# Patient Record
Sex: Male | Born: 1975 | Race: White | Hispanic: No | Marital: Married
Health system: Southern US, Community
[De-identification: ages and names within clinical notes are randomized; demographics above are authoritative.]

---

## 2006-06-26 ENCOUNTER — Emergency Department (HOSPITAL_COMMUNITY): Admission: EM | Admit: 2006-06-26 | Discharge: 2006-06-26 | Payer: Self-pay | Admitting: Family Medicine

## 2007-08-22 ENCOUNTER — Encounter: Admission: RE | Admit: 2007-08-22 | Discharge: 2007-08-22 | Payer: Self-pay | Admitting: Orthopedic Surgery

## 2007-08-28 ENCOUNTER — Encounter: Admission: RE | Admit: 2007-08-28 | Discharge: 2007-08-28 | Payer: Self-pay | Admitting: Orthopedic Surgery

## 2007-09-12 ENCOUNTER — Encounter: Admission: RE | Admit: 2007-09-12 | Discharge: 2007-09-12 | Payer: Self-pay | Admitting: Orthopedic Surgery

## 2008-03-06 ENCOUNTER — Encounter: Admission: RE | Admit: 2008-03-06 | Discharge: 2008-03-06 | Payer: Self-pay | Admitting: Orthopedic Surgery

## 2008-03-22 ENCOUNTER — Ambulatory Visit (HOSPITAL_COMMUNITY): Admission: RE | Admit: 2008-03-22 | Discharge: 2008-03-23 | Payer: Self-pay | Admitting: Neurosurgery

## 2010-11-29 ENCOUNTER — Encounter: Payer: Self-pay | Admitting: Orthopedic Surgery

## 2011-03-23 NOTE — H&P (Signed)
NAMERAYWOOD, WAILES NO.:  [id number]   MEDICAL RECORD NO.:  [id number]          PATIENT TYPE:  OIB   LOCATION:  3006                         FACILITY:  [facility]   PHYSICIAN:  Payton Doughty, M.D.      DATE OF BIRTH:  1976-10-09   DATE OF ADMISSION:  03/22/2008  DATE OF DISCHARGE:                              HISTORY & PHYSICAL   ADMITTING DIAGNOSIS:  Herniated disk at L5-S1, right.   A 35 year old right-handed white gentleman, who last summer had pain in  his back, down his right leg.  He had an MRI, which showed a disk, had  epidurals, did well for a while, and then in April, an appointment was  scheduled as he had an increasing pain down in his back and his right  leg.  MRI showed a large disk off to the right, and he is now admitted  for a lumbar discectomy.   MEDICAL HISTORY:  Benign.   SURGERIES:  Wisdom teeth.   MEDICATIONS:  He is on:  1. Skelaxin.  2. Oxycodone.  3. Etodolac.   SOCIAL HISTORY:  He smokes a pack of cigarettes a day, drinks alcohol on  a social basis and is a Nutritional therapist.   FAMILY HISTORY:  Mom is 24, in good health, and dad is 40, in good  health.   REVIEW OF SYSTEMS:  Remarkable for back pain, leg pain, and occasional  blackouts.   PHYSICAL EXAMINATION:  HEENT:  Within normal limits.  NECK:  He has good range of motion.  CHEST:  Clear.  CARDIAC:  Regular rate and rhythm.  ABDOMEN:  Nontender.  No hepatosplenomegaly.  EXTREMITIES:  Without clubbing or cyanosis.  Peripheral pulses are good.  GU:  Deferred.  NEUROLOGIC:  He is awake, alert, and oriented.  Cranial nerves are  intact.  Motor exam shows 5/5 strength throughout in upper and lower  extremities.  Sensory dysesthesias described in the right S1  distribution.  Reflexes are 2 at the knees, 2 at the left ankle, 1 at  the right.  Straight leg raise is positive on the right.  Reverse  straight leg raise is not positive.   MRI on the computer shows the disk at L5-S1 eccentric  to the right with  elevation of right S1 root.   CLINICAL IMPRESSION:  Herniated disk at L5-S1 with a right S1  radiculopathy.   PLAN:  Lumbar laminectomy and discectomy.  The risks and benefits have  been discussed with him, and he wishes to proceed.           ______________________________  Payton Doughty, M.D.     MWR/MEDQ  D:  03/22/2008  T:  03/23/2008  Job:  [id number]

## 2011-03-23 NOTE — Op Note (Signed)
NAMEEZEQUIAS, LARD NO.:  [id number]   MEDICAL RECORD NO.:  [id number]          PATIENT TYPE:  OIB   LOCATION:  3006                         FACILITY:  [facility]   PHYSICIAN:  Payton Doughty, M.D.      DATE OF BIRTH:  10/01/1976   DATE OF PROCEDURE:  03/22/2008  DATE OF DISCHARGE:                               OPERATIVE REPORT   PREOPERATIVE DIAGNOSIS:  Herniated disk on the right side at L5-S1.   POSTOPERATIVE DIAGNOSIS:  Herniated disk on the right side at L5-S1.   PROCEDURE:  1. Right L5-S1 laminectomy.  2. Diskectomy.   SURGEON:  Payton Doughty, MD   ANESTHESIA:  General endotracheal.   PREPARATION:  Prepped and draped with alcohol wipe.   COMPLICATIONS:  None.   NURSE ASSISTANT:  [place].   DOCTOR ASSISTANT:  Hewitt Shorts, MD   BODY OF TEXT:  A 35 year old gentleman with right S1 radiculopathy and  herniated disk at L5-S1 on the right, taken to the operating room,  smoothly anesthetized and intubated, placed prone on the operating  table.  Following shave, prep, and draped in usual sterile fashion, skin  and subcutaneous with 1% lidocaine with 1:100,000 epinephrine, the skin  was incised from over the lamina of L5 and was dissected free in  subperiosteal plane.  Intraoperative x-ray confirmed correctnessof  level.  Having correctness of level a hemilaminectomy was carried out to  the top of ligamentum flavum that was removed in retrograde fashion.  The lateral aspect of the dural sac was exposed as well as the right S1  root was gently retracted medially exposing a herniated disk contained  under some annular fibers.  These were divided and the disk fragment  removed without difficulty.  This resulted in immediate decompression of  the nerve root.  Following complete decompression and exploration of  disk space, all graspable fragments were removed.  Wound was irrigated.  Hemostasis was assured.  The nerve inspected, found to be free,  laminotomy was filled with Depo-Medrol soaked pad.  Successive layers of  0 Vicryl, 2-0 Vicryl, and 4-0 Vicryl were used to close.  Benzoin and  Steri-Strips were placed and made occlusive Telfa and OpSite.  The  patient returned to recovery room in good condition.          ______________________________  Payton Doughty, M.D.    MWR/MEDQ  D:  03/22/2008  T:  03/23/2008  Job:  [id number]

## 2017-03-22 ENCOUNTER — Other Ambulatory Visit: Payer: Self-pay | Admitting: Family Medicine

## 2017-03-22 ENCOUNTER — Ambulatory Visit
Admission: RE | Admit: 2017-03-22 | Discharge: 2017-03-22 | Disposition: A | Discharge status: Home / Self Care | Payer: No Typology Code available for payment source | Source: Ambulatory Visit | Attending: Family Medicine | Admitting: Family Medicine

## 2017-03-22 DIAGNOSIS — M25521 Pain in right elbow: Secondary | ICD-10-CM

## 2017-03-22 DIAGNOSIS — M25511 Pain in right shoulder: Secondary | ICD-10-CM

## 2017-03-31 ENCOUNTER — Other Ambulatory Visit: Payer: Self-pay | Admitting: Family Medicine

## 2017-03-31 DIAGNOSIS — T1590XA Foreign body on external eye, part unspecified, unspecified eye, initial encounter: Secondary | ICD-10-CM

## 2017-03-31 DIAGNOSIS — M542 Cervicalgia: Secondary | ICD-10-CM

## 2017-04-07 ENCOUNTER — Ambulatory Visit
Admission: RE | Admit: 2017-04-07 | Discharge: 2017-04-07 | Disposition: A | Discharge status: Home / Self Care | Payer: No Typology Code available for payment source | Source: Ambulatory Visit | Attending: Family Medicine | Admitting: Family Medicine

## 2017-04-07 DIAGNOSIS — T1590XA Foreign body on external eye, part unspecified, unspecified eye, initial encounter: Secondary | ICD-10-CM

## 2017-04-08 ENCOUNTER — Ambulatory Visit
Admission: RE | Admit: 2017-04-08 | Discharge: 2017-04-08 | Disposition: A | Discharge status: Home / Self Care | Payer: No Typology Code available for payment source | Source: Ambulatory Visit | Attending: Family Medicine | Admitting: Family Medicine

## 2017-04-08 DIAGNOSIS — M542 Cervicalgia: Secondary | ICD-10-CM

## 2017-04-18 ENCOUNTER — Other Ambulatory Visit: Payer: Self-pay | Admitting: Family Medicine

## 2017-04-18 DIAGNOSIS — M25511 Pain in right shoulder: Secondary | ICD-10-CM

## 2017-04-18 DIAGNOSIS — M542 Cervicalgia: Secondary | ICD-10-CM

## 2017-04-18 DIAGNOSIS — R29898 Other symptoms and signs involving the musculoskeletal system: Secondary | ICD-10-CM

## 2017-04-18 DIAGNOSIS — G8929 Other chronic pain: Secondary | ICD-10-CM

## 2017-04-19 ENCOUNTER — Other Ambulatory Visit: Payer: No Typology Code available for payment source

## 2017-05-24 ENCOUNTER — Other Ambulatory Visit: Payer: Self-pay | Admitting: Neurosurgery

## 2017-05-24 DIAGNOSIS — M5020 Other cervical disc displacement, unspecified cervical region: Secondary | ICD-10-CM

## 2017-06-02 ENCOUNTER — Ambulatory Visit
Admission: RE | Admit: 2017-06-02 | Discharge: 2017-06-02 | Disposition: A | Discharge status: Home / Self Care | Payer: No Typology Code available for payment source | Source: Ambulatory Visit | Attending: Neurosurgery | Admitting: Neurosurgery

## 2017-06-02 DIAGNOSIS — M5020 Other cervical disc displacement, unspecified cervical region: Secondary | ICD-10-CM

## 2017-06-02 MED ORDER — DIAZEPAM 5 MG PO TABS
5.0000 mg | ORAL_TABLET | Freq: Once | ORAL | Status: AC
  Administered 2017-06-02: 5 mg via ORAL

## 2017-06-02 MED ORDER — IOPAMIDOL (ISOVUE-M 300) INJECTION 61%
1.0000 mL | Freq: Once | INTRAMUSCULAR | Status: AC | PRN
Start: 2017-06-02 — End: 2017-06-02
  Administered 2017-06-02: 1 mL via EPIDURAL

## 2017-06-02 MED ORDER — TRIAMCINOLONE ACETONIDE 40 MG/ML IJ SUSP (RADIOLOGY)
60.0000 mg | Freq: Once | INTRAMUSCULAR | Status: AC
  Administered 2017-06-02: 60 mg via EPIDURAL

## 2017-06-02 NOTE — Discharge Instructions (Signed)

## 2017-07-13 ENCOUNTER — Other Ambulatory Visit: Payer: Self-pay | Admitting: Specialist

## 2017-07-13 DIAGNOSIS — L729 Follicular cyst of the skin and subcutaneous tissue, unspecified: Secondary | ICD-10-CM

## 2017-07-13 DIAGNOSIS — M25562 Pain in left knee: Secondary | ICD-10-CM

## 2017-07-22 ENCOUNTER — Ambulatory Visit
Admission: RE | Admit: 2017-07-22 | Discharge: 2017-07-22 | Disposition: A | Discharge status: Home / Self Care | Payer: No Typology Code available for payment source | Source: Ambulatory Visit | Attending: Specialist | Admitting: Specialist

## 2017-07-22 ENCOUNTER — Ambulatory Visit
Admission: RE | Admit: 2017-07-22 | Discharge: 2017-07-22 | Disposition: A | Discharge status: Home / Self Care | Payer: No Typology Code available for payment source | Source: Ambulatory Visit | Attending: Nurse Practitioner | Admitting: Nurse Practitioner

## 2017-07-22 ENCOUNTER — Other Ambulatory Visit: Payer: Self-pay | Admitting: Nurse Practitioner

## 2017-07-22 DIAGNOSIS — L729 Follicular cyst of the skin and subcutaneous tissue, unspecified: Secondary | ICD-10-CM

## 2017-07-22 DIAGNOSIS — M546 Pain in thoracic spine: Secondary | ICD-10-CM

## 2017-07-22 DIAGNOSIS — M25562 Pain in left knee: Secondary | ICD-10-CM

## 2018-10-19 IMAGING — CR DG ELBOW 2V*R*
2 series · 2 of 2 positions shown · non-contrast
Comparison: None.

CLINICAL DATA: Right elbow pain several weeks with locking. No
injury.

EXAM:
RIGHT ELBOW - 2 VIEW

[x elbow ap right]
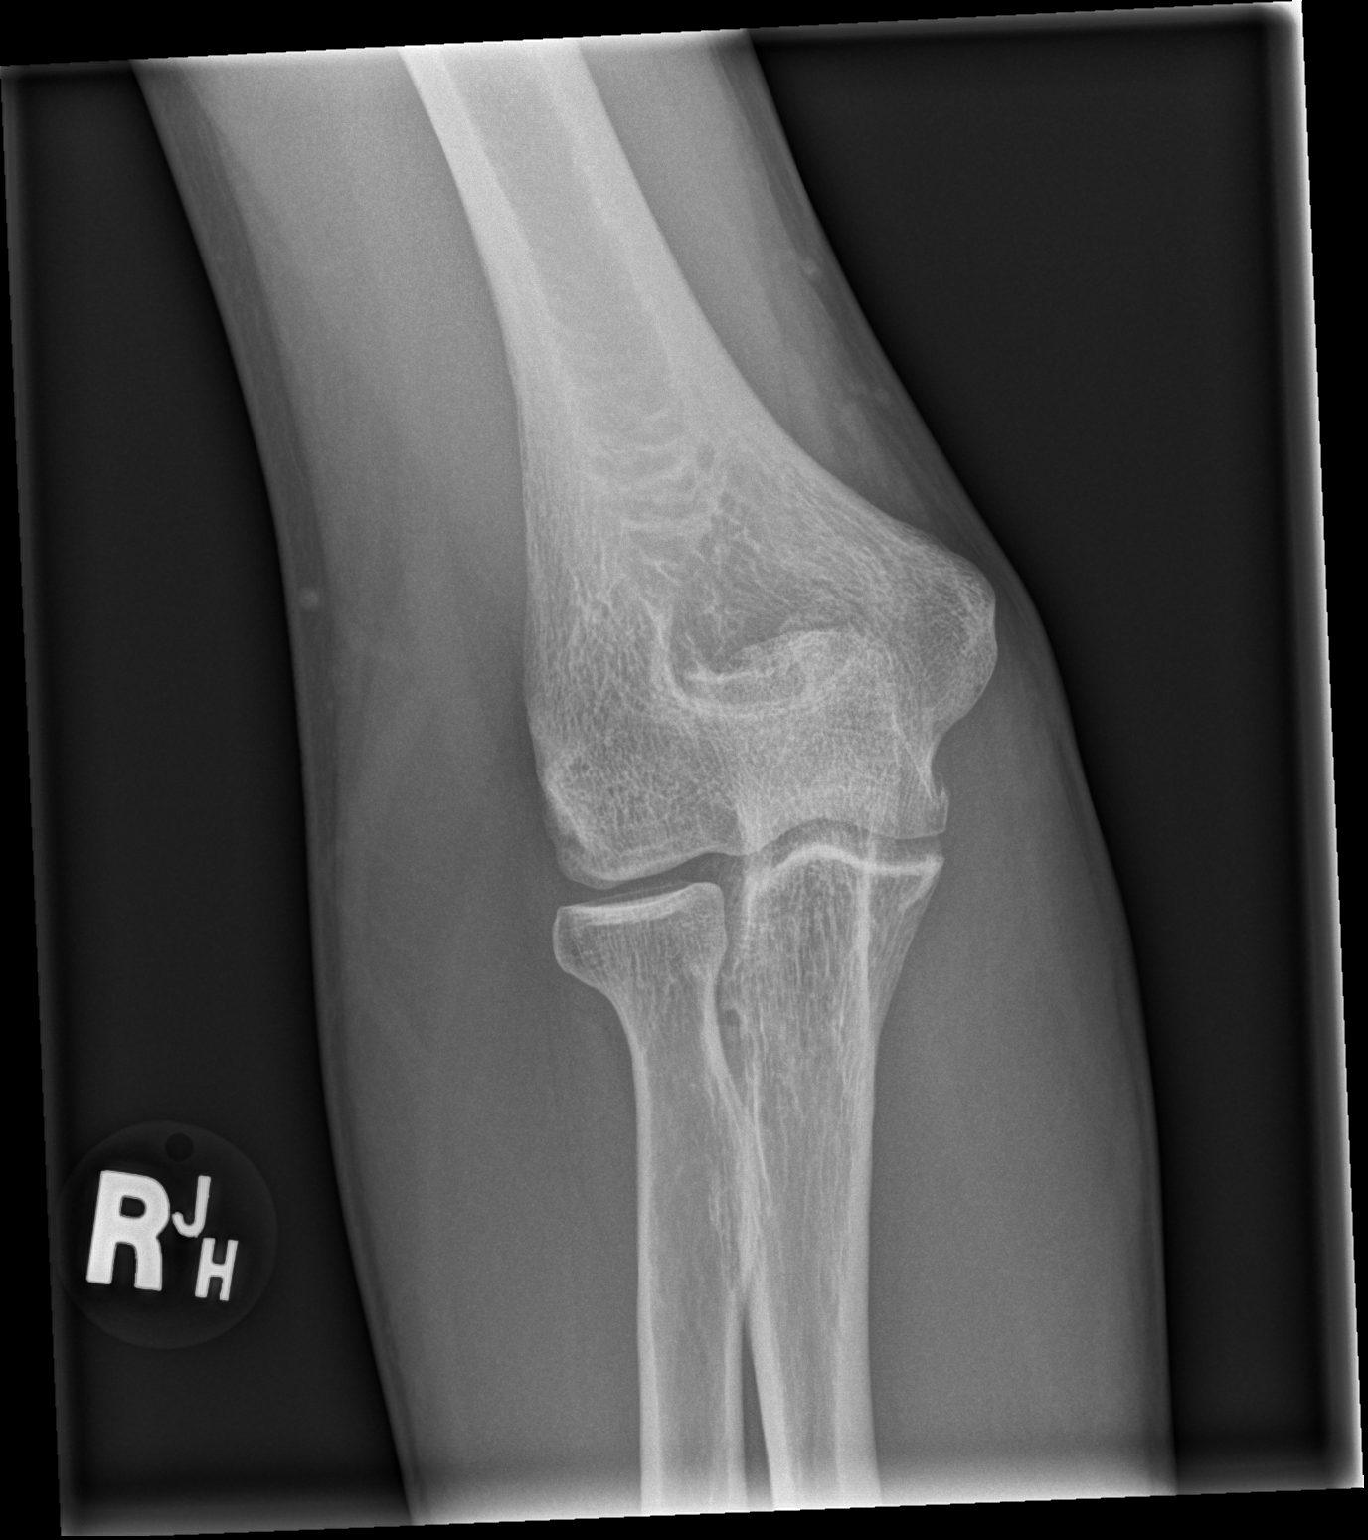

[x elbow lat right]
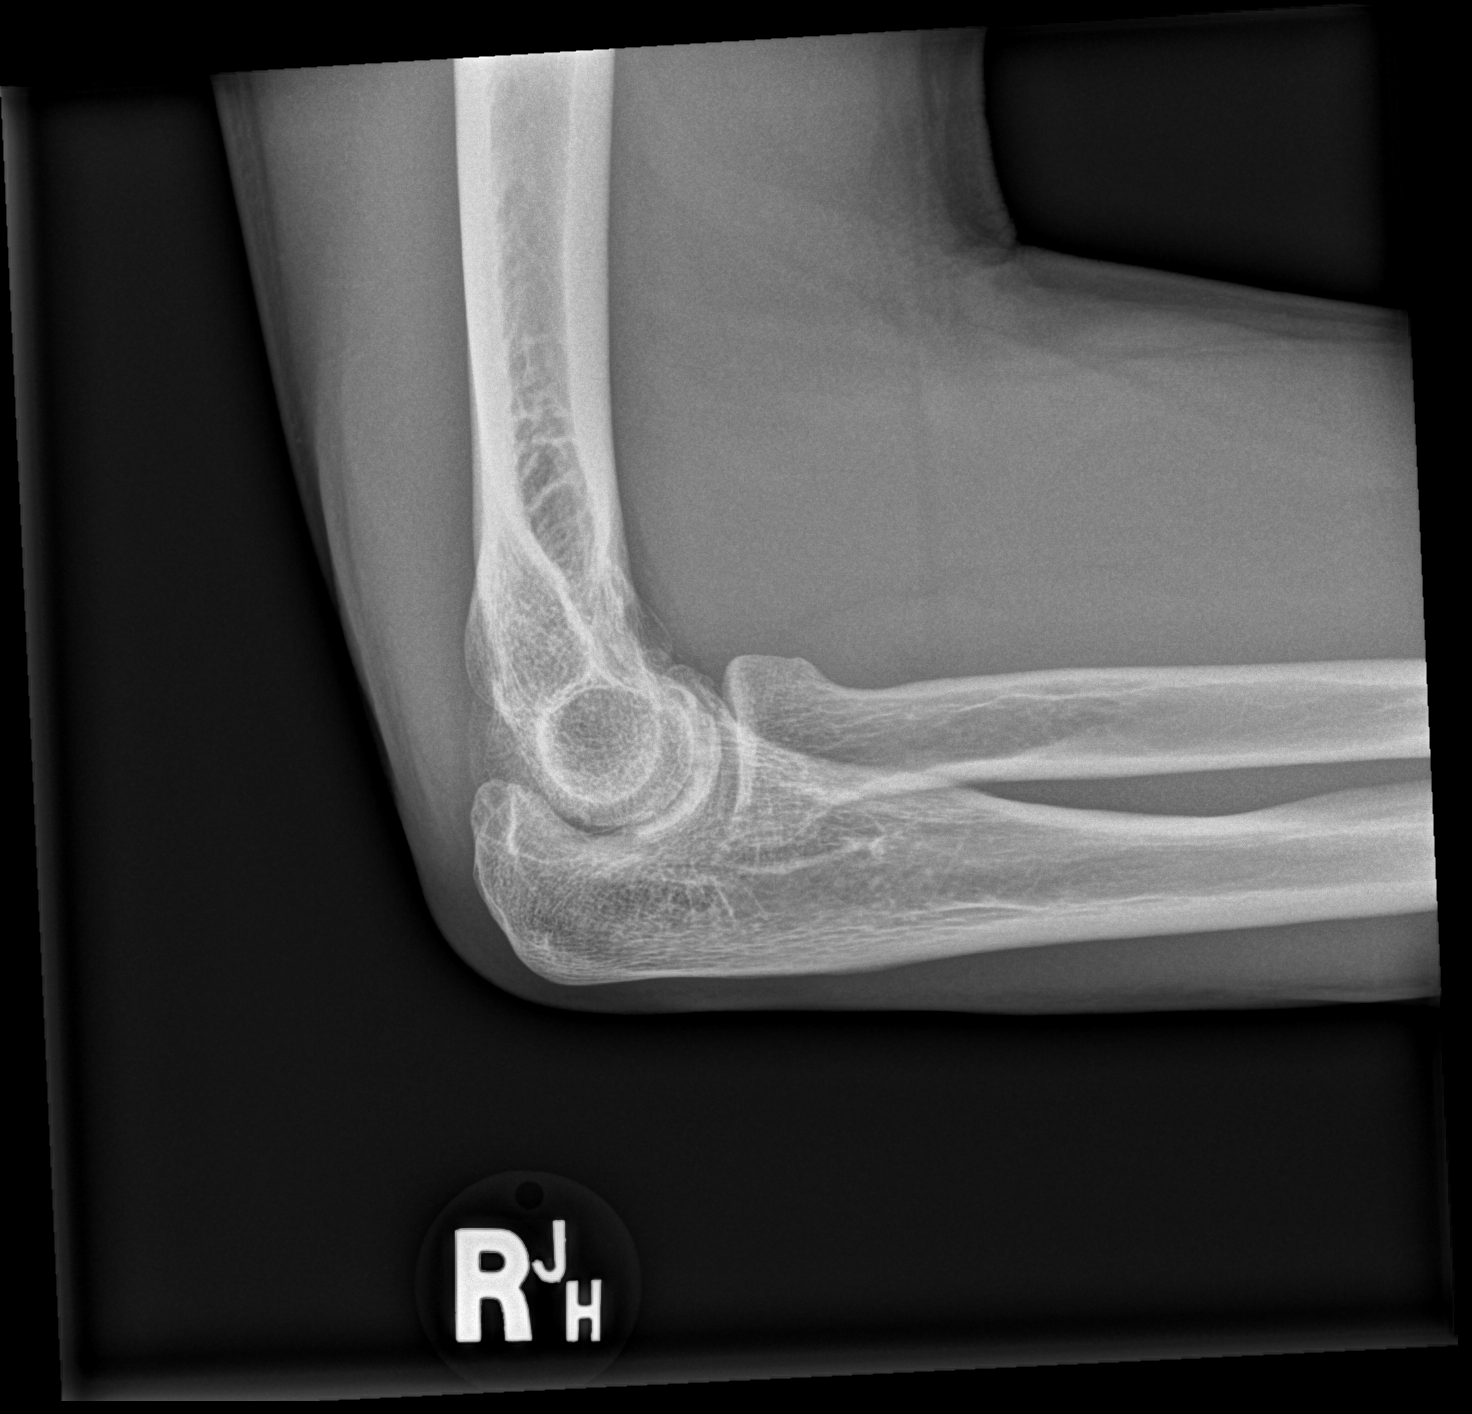

[2 of 2 positions shown; findings below may reference images not displayed]

FINDINGS: There is no evidence of fracture, dislocation, or joint effusion.
There is no evidence of arthropathy or other focal bone abnormality.
Soft tissues are unremarkable.
IMPRESSION: Negative.

## 2018-10-19 IMAGING — CR DG SHOULDER 2+V*R*
3 series · 3 of 3 positions shown · non-contrast
Comparison: None.

CLINICAL DATA: Chronic right shoulder pain 1 year worse recently.
No injury.

EXAM:
RIGHT SHOULDER - 2+ VIEW

[w shoulder grashey right]
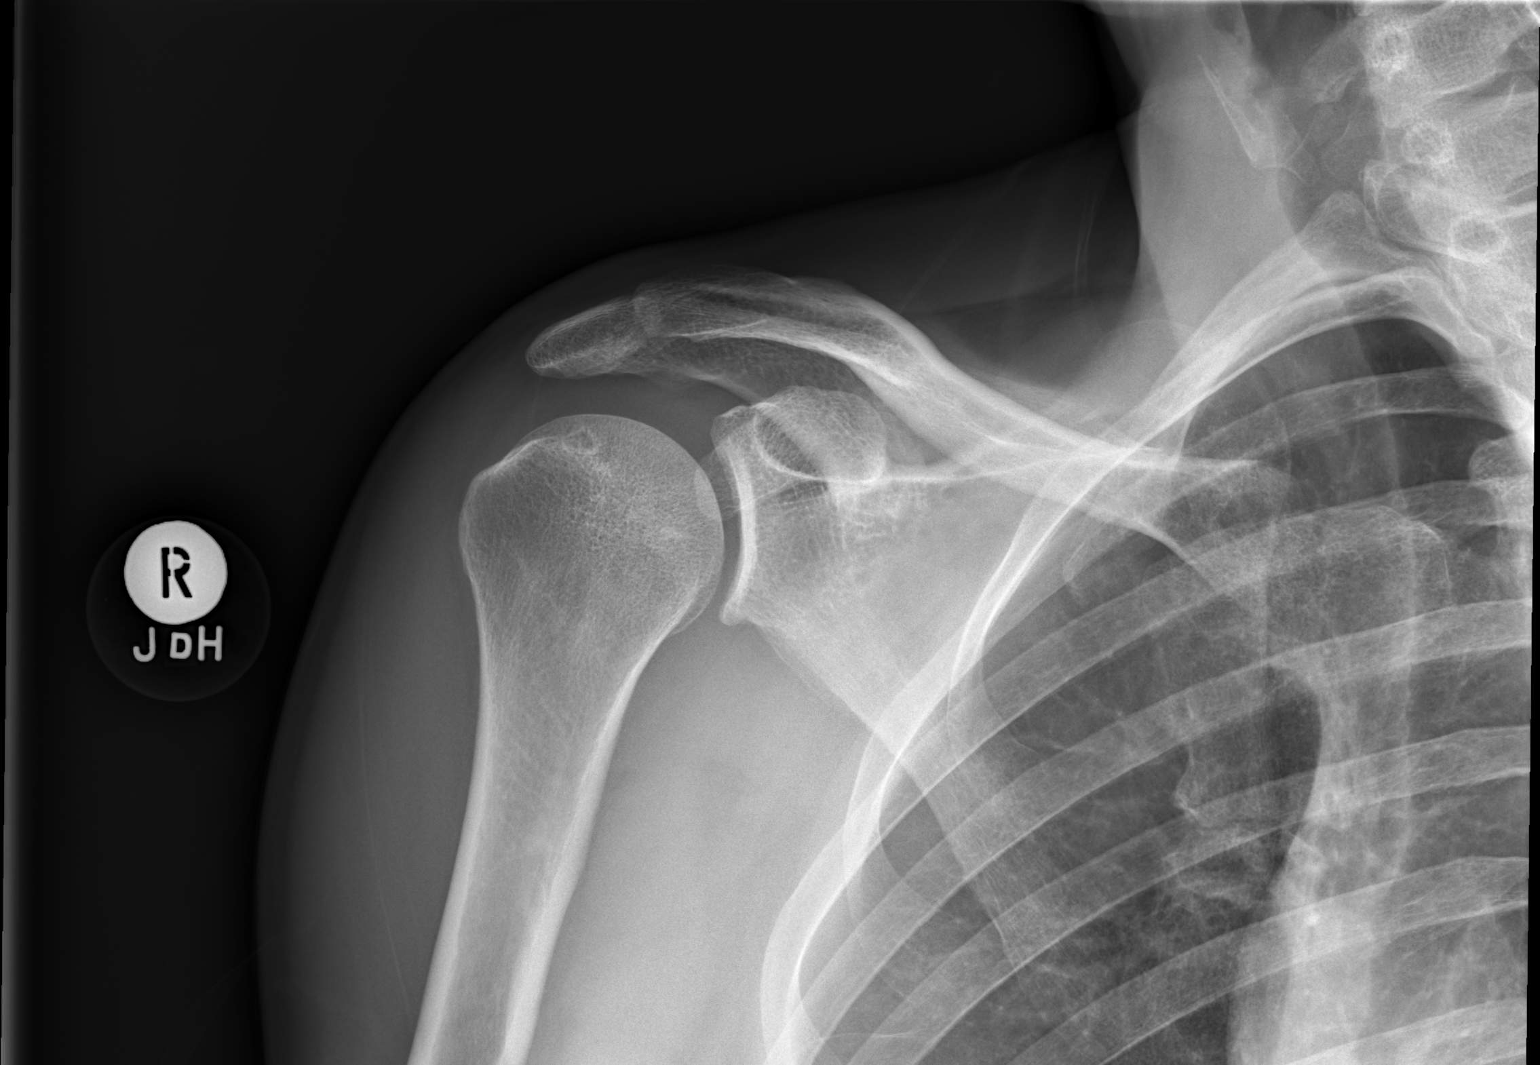

[w shoulder y-view right]
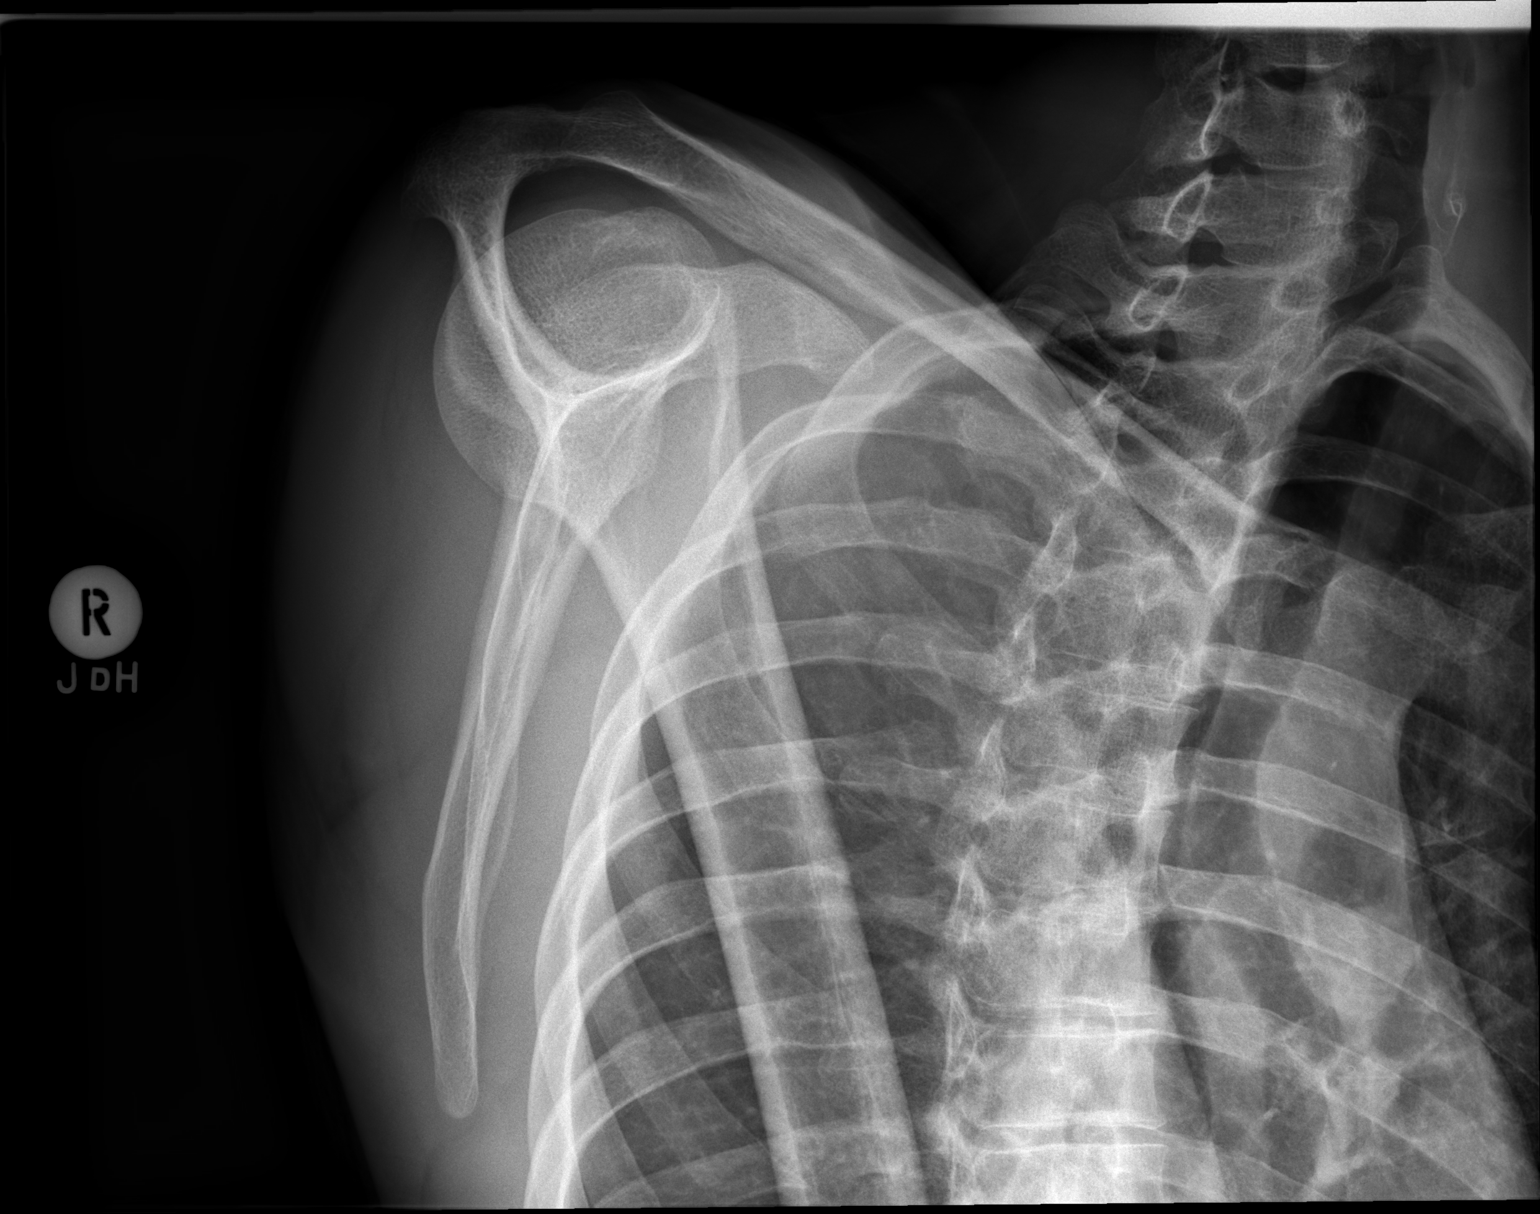

[w shoulder axillary right]
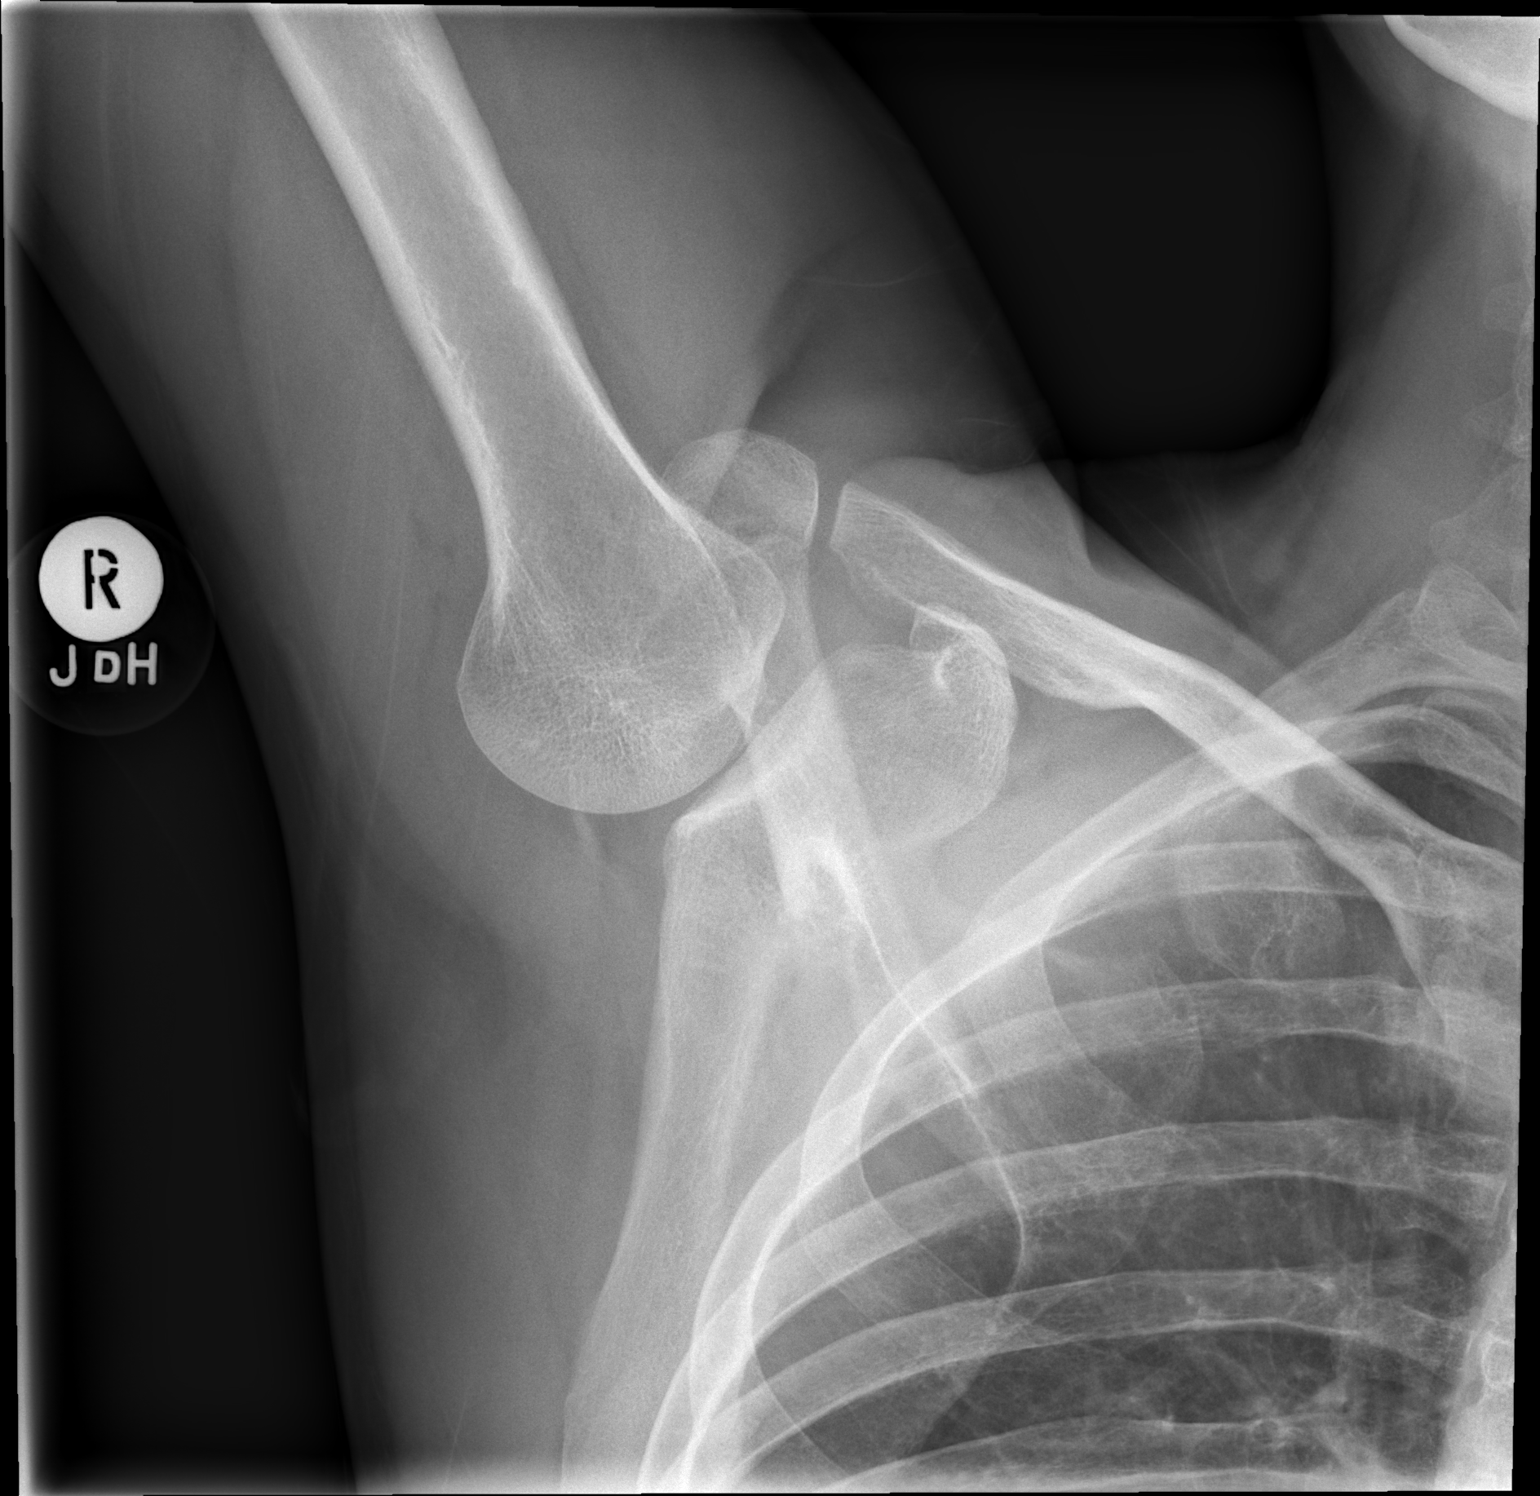

[3 of 3 positions shown; findings below may reference images not displayed]

FINDINGS: There is no evidence of fracture or dislocation. Subtle spurring of
the inferior glenoid. Soft tissues are within normal.
IMPRESSION: No acute findings.

Subtle degenerative change of the glenohumeral joint.
